# Patient Record
Sex: Female | Born: 1945
Health system: Southern US, Community
[De-identification: ages and names within clinical notes are randomized; demographics above are authoritative.]

## PROBLEM LIST (undated history)

## (undated) DIAGNOSIS — I10 Essential (primary) hypertension: Secondary | ICD-10-CM

## (undated) HISTORY — PX: BREAST BIOPSY: SHX20

## (undated) HISTORY — PX: COLONOSCOPY: SHX174

## (undated) HISTORY — PX: FOOT SURGERY: SHX648

---

## 2006-06-25 ENCOUNTER — Ambulatory Visit: Payer: Self-pay | Admitting: Family Medicine

## 2006-12-25 ENCOUNTER — Ambulatory Visit: Payer: Self-pay | Admitting: Gastroenterology

## 2008-03-03 ENCOUNTER — Ambulatory Visit: Payer: Self-pay | Admitting: Family Medicine

## 2009-05-18 ENCOUNTER — Ambulatory Visit: Payer: Self-pay | Admitting: Family Medicine

## 2010-05-29 ENCOUNTER — Ambulatory Visit: Payer: Self-pay | Admitting: Family Medicine

## 2011-06-04 ENCOUNTER — Ambulatory Visit: Payer: Self-pay | Admitting: Family Medicine

## 2012-12-27 ENCOUNTER — Emergency Department: Payer: Self-pay | Admitting: Emergency Medicine

## 2012-12-27 LAB — BASIC METABOLIC PANEL
Calcium, Total: 9.4 mg/dL (ref 8.5–10.1)
Co2: 28 mmol/L (ref 21–32)
EGFR (African American): >60
EGFR (Non-African Amer.): >60
Glucose: 102 mg/dL — ABNORMAL HIGH (ref 65–99)
Osmolality: 272 (ref 275–301)
Potassium: 3.6 mmol/L (ref 3.5–5.1)

## 2012-12-28 LAB — CBC
HCT: 43.9 % (ref 35.0–47.0)
MCH: 30 pg (ref 26.0–34.0)
MCHC: 33.4 g/dL (ref 32.0–36.0)
MCV: 90 fL (ref 80–100)
Platelet: 266 10*3/uL (ref 150–440)
RDW: 13.1 % (ref 11.5–14.5)
WBC: 9.4 10*3/uL (ref 3.6–11.0)

## 2014-03-22 ENCOUNTER — Ambulatory Visit: Payer: Self-pay | Admitting: Family Medicine

## 2015-01-24 ENCOUNTER — Ambulatory Visit: Payer: Self-pay | Admitting: Family Medicine

## 2015-08-05 ENCOUNTER — Emergency Department
Admission: EM | Admit: 2015-08-05 | Discharge: 2015-08-05 | Disposition: A | Discharge status: Home / Self Care | Payer: PPO | Attending: Emergency Medicine | Admitting: Emergency Medicine

## 2015-08-05 ENCOUNTER — Other Ambulatory Visit: Payer: Self-pay

## 2015-08-05 ENCOUNTER — Encounter: Payer: Self-pay | Admitting: Emergency Medicine

## 2015-08-05 DIAGNOSIS — I10 Essential (primary) hypertension: Secondary | ICD-10-CM | POA: Insufficient documentation

## 2015-08-05 DIAGNOSIS — I160 Hypertensive urgency: Secondary | ICD-10-CM

## 2015-08-05 HISTORY — DX: Essential (primary) hypertension: I10

## 2015-08-05 LAB — CBC WITH DIFFERENTIAL/PLATELET
BASOS PCT: 1 %
Basophils Absolute: 0.1 10*3/uL (ref 0–0.1)
EOS PCT: 1 %
Eosinophils Absolute: 0 10*3/uL (ref 0–0.7)
HEMATOCRIT: 41.8 % (ref 35.0–47.0)
Hemoglobin: 14.5 g/dL (ref 12.0–16.0)
LYMPHS PCT: 16 %
Lymphs Abs: 1.1 10*3/uL (ref 1.0–3.6)
MCH: 30.3 pg (ref 26.0–34.0)
MCHC: 34.6 g/dL (ref 32.0–36.0)
MCV: 87.7 fL (ref 80.0–100.0)
MONO ABS: 0.9 10*3/uL (ref 0.2–0.9)
MONOS PCT: 14 %
NEUTROS ABS: 4.7 10*3/uL (ref 1.4–6.5)
Neutrophils Relative %: 68 %
Platelets: 178 10*3/uL (ref 150–440)
RBC: 4.77 MIL/uL (ref 3.80–5.20)
RDW: 13.8 % (ref 11.5–14.5)
WBC: 6.8 10*3/uL (ref 3.6–11.0)

## 2015-08-05 LAB — COMPREHENSIVE METABOLIC PANEL
ALT: 27 U/L (ref 14–54)
AST: 29 U/L (ref 15–41)
Albumin: 4.2 g/dL (ref 3.5–5.0)
Alkaline Phosphatase: 74 U/L (ref 38–126)
Anion gap: 9 (ref 5–15)
BUN: 15 mg/dL (ref 6–20)
CO2: 26 mmol/L (ref 22–32)
Calcium: 8.9 mg/dL (ref 8.9–10.3)
Chloride: 94 mmol/L — ABNORMAL LOW (ref 101–111)
Creatinine, Ser: 0.57 mg/dL (ref 0.44–1.00)
GFR calc non Af Amer: >60 mL/min (ref >60)
GLUCOSE: 136 mg/dL — AB (ref 65–99)
Potassium: 3.8 mmol/L (ref 3.5–5.1)
Sodium: 129 mmol/L — ABNORMAL LOW (ref 135–145)
Total Bilirubin: 0.3 mg/dL (ref 0.3–1.2)
Total Protein: 7.3 g/dL (ref 6.5–8.1)

## 2015-08-05 LAB — TROPONIN I: Troponin I: <0.03 ng/mL (ref <0.031)

## 2015-08-05 MED ORDER — CLONIDINE HCL 0.1 MG PO TABS
0.1000 mg | ORAL_TABLET | Freq: Three times a day (TID) | ORAL | Status: AC | PRN
Start: 2015-08-05 — End: 2016-09-04

## 2015-08-05 MED ORDER — CLONIDINE HCL 0.1 MG PO TABS
0.1000 mg | ORAL_TABLET | Freq: Once | ORAL | Status: AC
  Administered 2015-08-05: 0.1 mg via ORAL
  Filled 2015-08-05: qty 21

## 2015-08-05 MED ORDER — LORAZEPAM 1 MG PO TABS
1.0000 mg | ORAL_TABLET | Freq: Once | ORAL | Status: AC
  Administered 2015-08-05: 1 mg via ORAL
  Filled 2015-08-05: qty 1

## 2015-08-05 NOTE — ED Provider Notes (Signed)
[facility] Emergency Department Provider Note  ____________________________________________  Time seen: ----------------------------------------- 6:58 PM on 08/05/2015 -----------------------------------------    I have reviewed the triage vital signs and the nursing notes.   HISTORY  Chief Complaint Hypertension    HPI Kristina Jenkins is a 69 y.o. female presents with feelings that her blood pressure is elevated. Patient states she gets a headache and feels nauseated when her blood pressure goes up. She didn't have any real hot numbers at home. Here blood pressure is 142/75 which she states is above her normal. She denies any persistent chest pain or shortness of breath. She states she felt like her heart rate was elevated. She denies any arm or jaw pain. She denies any localization of her headache with no neck pain. She denies any focal weakness in either upper or lower extremities. No difficulty with speech or swallowing.     Past Medical History  Diagnosis Date  . Hypertension     There are no active problems to display for this patient.   Past Surgical History  Procedure Laterality Date  . Foot surgery      No current outpatient prescriptions on file.  Allergies Review of patient's allergies indicates no known allergies.  No family history on file.  Social History History  Substance Use Topics  . Smoking status: Never Smoker   . Smokeless tobacco: Not on file  . Alcohol Use: Not on file    Review of Systems  Constitutional: Negative for fever. Eyes: Negative for visual changes. ENT: Negative for sore throat Cardiovascular: Negative for chest pain. Respiratory: Negative for shortness of breath. Gastrointestinal: Negative for abdominal pain, vomiting and diarrhea. Genitourinary: Negative for dysuria. Musculoskeletal: Negative for back pain. Skin: Negative for rash. Neurological: Negative for headaches or focal  weakness Psychiatric: Patient is very anxious.    ____________________________________________   PHYSICAL EXAM:  VITAL SIGNS: ED Triage Vitals  Enc Vitals Group     BP 08/05/15 1828 144/72 mmHg     Pulse Rate 08/05/15 1828 93     Resp 08/05/15 1828 18     Temp 08/05/15 1828 98.3 F (36.8 C)     Temp Source 08/05/15 1828 Oral     SpO2 08/05/15 1828 98 %     Weight 08/05/15 1828 140 lb (63.504 kg)     Height 08/05/15 1828 5\' 2"  (1.575 m)     Head Cir --      Peak Flow --      Pain Score 08/05/15 1829 4     Pain Loc --      Pain Edu? --      Excl. in [place]? --      Constitutional: Alert and oriented. Well appearing and in no distress. Eyes: Conjunctivae are normal.  ENT   Head: Normocephalic and atraumatic.   Mouth/Throat: Mucous membranes are moist. Cardiovascular: Normal rate, regular rhythm. Normal and symmetric distal pulses are present in all extremities. No murmurs, rubs, or gallops. Respiratory: Normal respiratory effort without tachypnea nor retractions. Breath sounds are clear and equal bilaterally.  Gastrointestinal: Soft and non-tender in all quadrants. No distention. There is no CVA tenderness. Genitourinary: deferred Musculoskeletal: Nontender with normal range of motion in all extremities. No lower extremity tenderness nor edema. Neurologic:  Normal speech and language. No gross focal neurologic deficits are appreciated. Skin:  Skin is warm, dry and intact. No rash noted. Psychiatric: Mood and affect are normal. Patient exhibits appropriate insight and judgment.  ____________________________________________  LABS (pertinent positives/negatives)  Labs Reviewed  CBC WITH DIFFERENTIAL/PLATELET  COMPREHENSIVE METABOLIC PANEL  TROPONIN I    ____________________________________________   EKG  ED ECG REPORT I, Daymon Larsen, the attending physician, personally viewed and interpreted this ECG.  Date: 08/05/2015 EKG Time: 1834 Rate:  87 Rhythm: normal sinus rhythm QRS Axis: normal Intervals: normal ST/T Wave abnormalities: Nonspecific T wave abnormality Conduction Disutrbances: none Narrative Interpretation: unremarkable ischemic changes are noted  ____________________________________________    ____________________________________________   INITIAL IMPRESSION / ASSESSMENT AND PLAN / ED COURSE  Pertinent labs & imaging results that were available during my care of the patient were reviewed by me and considered in my medical decision making (see chart for details). Patient's ED course was uneventful. She was given Ativan for anxiety along with  with clonidine for her blood pressure which gradually decreased over time. Her symptoms all seem to be consistent with her hypertension and systolic pressures are not life-threatening there are symptomatic for the patient. She was prescribed clonidine for backup blood pressure controlled at home. Patient QUESTIONS and concerns were addressed at the bedside. She was discharged home with her husband  ____________________________________________   FINAL CLINICAL IMPRESSION(S) / ED DIAGNOSES Hypertensive urgency Final diagnoses:  None     Daymon Larsen, MD 08/05/15 2149

## 2015-08-05 NOTE — Discharge Instructions (Signed)
Hypertension °Hypertension, commonly called high blood pressure, is when the force of blood pumping through your arteries is too strong. Your arteries are the blood vessels that carry blood from your heart throughout your body. A blood pressure reading consists of a higher number over a lower number, such as 110/72. The higher number (systolic) is the pressure inside your arteries when your heart pumps. The lower number (diastolic) is the pressure inside your arteries when your heart relaxes. Ideally you want your blood pressure below 120/80. °Hypertension forces your heart to work harder to pump blood. Your arteries may become narrow or stiff. Having hypertension puts you at risk for heart disease, stroke, and other problems.  °RISK FACTORS °Some risk factors for high blood pressure are controllable. Others are not.  °Risk factors you cannot control include:  °· Race. You may be at higher risk if you are African American. °· Age. Risk increases with age. °· Gender. Men are at higher risk than women before age 45 years. After age 65, women are at higher risk than men. °Risk factors you can control include: °· Not getting enough exercise or physical activity. °· Being overweight. °· Getting too much fat, sugar, calories, or salt in your diet. °· Drinking too much alcohol. °SIGNS AND SYMPTOMS °Hypertension does not usually cause signs or symptoms. Extremely high blood pressure (hypertensive crisis) may cause headache, anxiety, shortness of breath, and nosebleed. °DIAGNOSIS  °To check if you have hypertension, your health care provider will measure your blood pressure while you are seated, with your arm held at the level of your heart. It should be measured at least twice using the same arm. Certain conditions can cause a difference in blood pressure between your right and left arms. A blood pressure reading that is higher than normal on one occasion does not mean that you need treatment. If one blood pressure reading  is high, ask your health care provider about having it checked again. °TREATMENT  °Treating high blood pressure includes making lifestyle changes and possibly taking medicine. Living a healthy lifestyle can help lower high blood pressure. You may need to change some of your habits. °Lifestyle changes may include: °· Following the DASH diet. This diet is high in fruits, vegetables, and whole grains. It is low in salt, red meat, and added sugars. °· Getting at least 2½ hours of brisk physical activity every week. °· Losing weight if necessary. °· Not smoking. °· Limiting alcoholic beverages. °· Learning ways to reduce stress. ° If lifestyle changes are not enough to get your blood pressure under control, your health care provider may prescribe medicine. You may need to take more than one. Work closely with your health care provider to understand the risks and benefits. °HOME CARE INSTRUCTIONS °· Have your blood pressure rechecked as directed by your health care provider.   °· Take medicines only as directed by your health care provider. Follow the directions carefully. Blood pressure medicines must be taken as prescribed. The medicine does not work as well when you skip doses. Skipping doses also puts you at risk for problems.   °· Do not smoke.   °· Monitor your blood pressure at home as directed by your health care provider.  °SEEK MEDICAL CARE IF:  °· You think you are having a reaction to medicines taken. °· You have recurrent headaches or feel dizzy. °· You have swelling in your ankles. °· You have trouble with your vision. °SEEK IMMEDIATE MEDICAL CARE IF: °· You develop a severe headache or confusion. °·   You have unusual weakness, numbness, or feel faint.  You have severe chest or abdominal pain.  You vomit repeatedly.  You have trouble breathing. MAKE SURE YOU:   Understand these instructions.  Will watch your condition.  Will get help right away if you are not doing well or get worse. Document  Released: 12/16/2005 Document Revised: 05/02/2014 Document Reviewed: 10/08/2013 [facility] Patient Information 2015 [place], [place]. This information is not intended to replace advice given to you by your health care provider. Make sure you discuss any questions you have with your health care provider.   Please return if condition worsens. Please return if he have any persistent headache, chest pain, shortness of breath or any other new concerns.

## 2015-08-05 NOTE — ED Notes (Signed)
Pt reports she was checking BP at home and was increasing.  Also c/o headache and feeling nauseated.  Poor appetite, but was able to eat this morning.  Flushed in the face.

## 2015-08-05 NOTE — ED Notes (Signed)
Complaining of hypertension and increased heart rate, headache x1day , denies visual change.

## 2016-06-14 DIAGNOSIS — Z79899 Other long term (current) drug therapy: Secondary | ICD-10-CM | POA: Diagnosis not present

## 2016-06-14 DIAGNOSIS — M8580 Other specified disorders of bone density and structure, unspecified site: Secondary | ICD-10-CM | POA: Diagnosis not present

## 2016-06-14 DIAGNOSIS — I10 Essential (primary) hypertension: Secondary | ICD-10-CM | POA: Diagnosis not present

## 2016-06-14 DIAGNOSIS — Z7982 Long term (current) use of aspirin: Secondary | ICD-10-CM | POA: Diagnosis not present

## 2016-06-14 DIAGNOSIS — E7800 Pure hypercholesterolemia, unspecified: Secondary | ICD-10-CM | POA: Diagnosis not present

## 2016-06-25 DIAGNOSIS — I10 Essential (primary) hypertension: Secondary | ICD-10-CM | POA: Diagnosis not present

## 2016-06-25 DIAGNOSIS — Z1239 Encounter for other screening for malignant neoplasm of breast: Secondary | ICD-10-CM | POA: Diagnosis not present

## 2016-06-25 DIAGNOSIS — Z0000 Encounter for general adult medical examination without abnormal findings: Secondary | ICD-10-CM | POA: Diagnosis not present

## 2016-06-25 DIAGNOSIS — E7800 Pure hypercholesterolemia, unspecified: Secondary | ICD-10-CM | POA: Diagnosis not present

## 2016-06-25 DIAGNOSIS — M8580 Other specified disorders of bone density and structure, unspecified site: Secondary | ICD-10-CM | POA: Diagnosis not present

## 2016-06-25 DIAGNOSIS — Z1211 Encounter for screening for malignant neoplasm of colon: Secondary | ICD-10-CM | POA: Diagnosis not present

## 2016-10-15 DIAGNOSIS — S8000XA Contusion of unspecified knee, initial encounter: Secondary | ICD-10-CM | POA: Diagnosis not present

## 2016-10-22 DIAGNOSIS — M25561 Pain in right knee: Secondary | ICD-10-CM | POA: Diagnosis not present

## 2016-10-22 DIAGNOSIS — M25562 Pain in left knee: Secondary | ICD-10-CM | POA: Diagnosis not present

## 2016-11-04 DIAGNOSIS — Z8371 Family history of colonic polyps: Secondary | ICD-10-CM | POA: Diagnosis not present

## 2016-11-04 DIAGNOSIS — Z719 Counseling, unspecified: Secondary | ICD-10-CM | POA: Diagnosis not present

## 2017-02-06 ENCOUNTER — Other Ambulatory Visit: Payer: Self-pay | Admitting: Family Medicine

## 2017-02-06 DIAGNOSIS — Z1231 Encounter for screening mammogram for malignant neoplasm of breast: Secondary | ICD-10-CM

## 2017-02-17 ENCOUNTER — Encounter: Payer: Self-pay | Admitting: *Deleted

## 2017-02-18 ENCOUNTER — Ambulatory Visit: Payer: PPO | Admitting: *Deleted

## 2017-02-18 ENCOUNTER — Encounter: Payer: Self-pay | Admitting: *Deleted

## 2017-02-18 ENCOUNTER — Encounter
Admission: RE | Disposition: A | Discharge status: Home / Self Care | Payer: Self-pay | Source: Ambulatory Visit | Attending: Gastroenterology

## 2017-02-18 ENCOUNTER — Ambulatory Visit
Admission: RE | Admit: 2017-02-18 | Discharge: 2017-02-18 | Disposition: A | Discharge status: Home / Self Care | Payer: PPO | Source: Ambulatory Visit | Attending: Gastroenterology | Admitting: Gastroenterology

## 2017-02-18 DIAGNOSIS — K644 Residual hemorrhoidal skin tags: Secondary | ICD-10-CM | POA: Insufficient documentation

## 2017-02-18 DIAGNOSIS — K5730 Diverticulosis of large intestine without perforation or abscess without bleeding: Secondary | ICD-10-CM | POA: Insufficient documentation

## 2017-02-18 DIAGNOSIS — Z7982 Long term (current) use of aspirin: Secondary | ICD-10-CM | POA: Insufficient documentation

## 2017-02-18 DIAGNOSIS — I10 Essential (primary) hypertension: Secondary | ICD-10-CM | POA: Diagnosis not present

## 2017-02-18 DIAGNOSIS — D121 Benign neoplasm of appendix: Secondary | ICD-10-CM | POA: Insufficient documentation

## 2017-02-18 DIAGNOSIS — Z1211 Encounter for screening for malignant neoplasm of colon: Secondary | ICD-10-CM | POA: Insufficient documentation

## 2017-02-18 DIAGNOSIS — Z8371 Family history of colonic polyps: Secondary | ICD-10-CM | POA: Diagnosis not present

## 2017-02-18 DIAGNOSIS — K641 Second degree hemorrhoids: Secondary | ICD-10-CM | POA: Diagnosis not present

## 2017-02-18 DIAGNOSIS — D120 Benign neoplasm of cecum: Secondary | ICD-10-CM | POA: Diagnosis not present

## 2017-02-18 DIAGNOSIS — Z79899 Other long term (current) drug therapy: Secondary | ICD-10-CM | POA: Diagnosis not present

## 2017-02-18 DIAGNOSIS — K5790 Diverticulosis of intestine, part unspecified, without perforation or abscess without bleeding: Secondary | ICD-10-CM | POA: Diagnosis not present

## 2017-02-18 DIAGNOSIS — K635 Polyp of colon: Secondary | ICD-10-CM | POA: Diagnosis not present

## 2017-02-18 DIAGNOSIS — K648 Other hemorrhoids: Secondary | ICD-10-CM | POA: Diagnosis not present

## 2017-02-18 HISTORY — PX: COLONOSCOPY WITH PROPOFOL: SHX5780

## 2017-02-18 SURGERY — COLONOSCOPY WITH PROPOFOL
Anesthesia: General

## 2017-02-18 MED ORDER — PROPOFOL 500 MG/50ML IV EMUL
INTRAVENOUS | Status: AC
  Filled 2017-02-18: qty 50

## 2017-02-18 MED ORDER — GLYCOPYRROLATE 0.2 MG/ML IJ SOLN
INTRAMUSCULAR | Status: AC
  Filled 2017-02-18: qty 1

## 2017-02-18 MED ORDER — PROPOFOL 500 MG/50ML IV EMUL
INTRAVENOUS | Status: AC
Start: 2017-02-18 — End: 2017-02-18
  Filled 2017-02-18: qty 50

## 2017-02-18 MED ORDER — SODIUM CHLORIDE 0.9 % IV SOLN: INTRAVENOUS | Status: DC

## 2017-02-18 MED ORDER — PROPOFOL 500 MG/50ML IV EMUL
INTRAVENOUS | Status: DC | PRN
  Administered 2017-02-18: 100 ug/kg/min via INTRAVENOUS

## 2017-02-18 MED ORDER — SODIUM CHLORIDE 0.9 % IV SOLN
INTRAVENOUS | Status: DC
  Administered 2017-02-18 (×2): via INTRAVENOUS

## 2017-02-18 MED ORDER — PROPOFOL 10 MG/ML IV BOLUS
INTRAVENOUS | Status: AC
Start: 2017-02-18 — End: 2017-02-18
  Filled 2017-02-18: qty 20

## 2017-02-18 NOTE — H&P (Signed)
Outpatient short stay form Pre-procedure 02/18/2017 11:22 AM Lollie Sails MD  Primary Physician: Dr. Gayland Curry  Reason for visit:  Colonoscopy  History of present illness:  Patient is a 71 year old female presenting today as above. She tolerated her prep well. She takes no aspirin or blood thinning agents.    Current Facility-Administered Medications:  .  0.9 %  sodium chloride infusion, , Intravenous, Continuous, Lollie Sails, MD, Last Rate: 20 mL/hr at 02/18/17 1029 .  0.9 %  sodium chloride infusion, , Intravenous, Continuous, Lollie Sails, MD  Prescriptions Prior to Admission  Medication Sig Dispense Refill Last Dose  . amLODipine (NORVASC) 5 MG tablet Take 5 mg by mouth daily.   02/18/2017 at Unknown time  . aspirin EC 81 MG tablet Take 81 mg by mouth daily.   02/12/2017  . CALCIUM CARBONATE PO Take by mouth.     . Multiple Vitamin (MULTIVITAMIN) capsule Take 1 capsule by mouth daily.   Past Week at Unknown time  . omega-3 acid ethyl esters (LOVAZA) 1 g capsule Take by mouth 2 (two) times daily.   Past Week at Unknown time  . cloNIDine (CATAPRES) 0.1 MG tablet Take 1 tablet (0.1 mg total) by mouth 3 (three) times daily as needed (hypertension). 30 tablet 11      Allergies  Allergen Reactions  . Hctz [Hydrochlorothiazide]      Past Medical History:  Diagnosis Date  . Hypertension     Review of systems:      Physical Exam    Heart and lungs: Regular rate and rhythm without rub or gallop, lungs are bilaterally clear.    HEENT: Normocephalic atraumatic eyes are anicteric    Other:     Pertinant exam for procedure: Soft nontender nondistended bowel sounds positive normoactive.    Planned proceedures: Colonoscopy and indicated procedures. I have discussed the risks benefits and complications of procedures to include not limited to bleeding, infection, perforation and the risk of sedation and the patient wishes to proceed.    Lollie Sails, MD Gastroenterology 02/18/2017  11:22 AM

## 2017-02-18 NOTE — Transfer of Care (Signed)
Immediate Anesthesia Transfer of Care Note  Patient: Kristina Jenkins  Procedure(s) Performed: Procedure(s): COLONOSCOPY WITH PROPOFOL (N/A)  Patient Location: PACU  Anesthesia Type:General  Level of Consciousness: awake, alert  and oriented  Airway & Oxygen Therapy: Patient Spontanous Breathing and Patient connected to nasal cannula oxygen  Post-op Assessment: Report given to RN and Post -op Vital signs reviewed and stable  Post vital signs: Reviewed and stable  Last Vitals:  Vitals:   02/18/17 1006  BP: (!) 108/54  Pulse: 74  Resp: 16  Temp: 36.6 C    Last Pain:  Vitals:   02/18/17 1006  TempSrc: Tympanic      Patients Stated Pain Goal: 0 ([id number])  Complications: No apparent anesthesia complications

## 2017-02-18 NOTE — Anesthesia Post-op Follow-up Note (Cosign Needed)
Anesthesia QCDR form completed.        

## 2017-02-18 NOTE — Anesthesia Postprocedure Evaluation (Signed)
Anesthesia Post Note  Patient: Kristina Jenkins  Procedure(s) Performed: Procedure(s) (LRB): COLONOSCOPY WITH PROPOFOL (N/A)  Patient location during evaluation: Endoscopy Anesthesia Type: General Level of consciousness: awake and alert Pain management: pain level controlled Vital Signs Assessment: post-procedure vital signs reviewed and stable Respiratory status: spontaneous breathing, nonlabored ventilation, respiratory function stable and patient connected to nasal cannula oxygen Cardiovascular status: blood pressure returned to baseline and stable Postop Assessment: no signs of nausea or vomiting Anesthetic complications: no     Last Vitals:  Vitals:   02/18/17 1224 02/18/17 1234  BP: 118/74 111/69  Pulse: 80 85  Resp: 17 (!) 23  Temp:      Last Pain:  Vitals:   02/18/17 1214  TempSrc: Tympanic                 Martha Clan

## 2017-02-18 NOTE — Op Note (Signed)
[facility] Gastroenterology Patient Name: Kristina Jenkins Procedure Date: 02/18/2017 11:17 AM MRN: [id number] Account #: [id number] Date of Birth: 1946-01-05 Admit Type: Outpatient Age: 71 Room: [facility] ENDO ROOM 3 Gender: Female Note Status: Finalized Procedure:            Colonoscopy Indications:          Family history of colonic polyps in a first-degree                        relative Providers:            Lollie Sails, MD Referring MD:         Gayland Curry MD, MD (Referring MD) Medicines:            Monitored Anesthesia Care Complications:        No immediate complications. Procedure:            Pre-Anesthesia Assessment:                       - ASA Grade Assessment: II - A patient with mild                        systemic disease.                       After obtaining informed consent, the colonoscope was                        passed under direct vision. Throughout the procedure,                        the patient's blood pressure, pulse, and oxygen                        saturations were monitored continuously. The                        Colonoscope was introduced through the anus and                        advanced to the the cecum, identified by appendiceal                        orifice and ileocecal valve. The colonoscopy was                        performed with moderate difficulty due to significant                        looping. Successful completion of the procedure was                        aided by using manual pressure. The patient tolerated                        the procedure well. The quality of the bowel                        preparation was good. Findings:      Multiple small-mouthed diverticula were found in the sigmoid colon and  distal descending colon.      A 3 mm polyp was found in the appendiceal orifice. The polyp was       sessile. The polyp was removed with a cold biopsy forceps. Resection and       retrieval were  complete.      A 6 mm polyp was found in the cecum. The polyp was sessile. The polyp       was removed with a cold biopsy forceps. The polyp was removed with a       cold snare. The polyp was removed with a lift and cut technique using a       cold snare. Resection and retrieval were complete.      Non-bleeding external and internal hemorrhoids were found during       retroflexion and during anoscopy. The hemorrhoids were small and Grade       II (internal hemorrhoids that prolapse but reduce spontaneously).      No additional abnormalities were found on retroflexion.      The digital rectal exam was normal otherwise. Impression:           - Diverticulosis in the sigmoid colon and in the distal                        descending colon.                       - One 3 mm polyp at the appendiceal orifice, removed                        with a cold biopsy forceps. Resected and retrieved.                       - One 6 mm polyp in the cecum, removed with a cold                        snare, removed using lift and cut and a cold snare and                        removed with a cold biopsy forceps. Resected and                        retrieved.                       - Non-bleeding external and internal hemorrhoids. Recommendation:       - Discharge patient to home.                       - Telephone GI clinic for pathology results in 1 week. Procedure Code(s):    --- Professional ---                       [phone], Colonoscopy, flexible; with removal of tumor(s),                        polyp(s), or other lesion(s) by snare technique                       [id number], 59, Colonoscopy, flexible; with biopsy, single  or multiple Diagnosis Code(s):    --- Professional ---                       K64.1, Second degree hemorrhoids                       D12.1, Benign neoplasm of appendix                       D12.0, Benign neoplasm of cecum                       Z83.71, Family history of colonic  polyps                       K57.30, Diverticulosis of large intestine without                        perforation or abscess without bleeding CPT copyright 2016 American Medical Association. All rights reserved. The codes documented in this report are preliminary and upon coder review may  be revised to meet current compliance requirements. Lollie Sails, MD 02/18/2017 12:14:46 PM This report has been signed electronically. Number of Addenda: 0 Note Initiated On: 02/18/2017 11:17 AM Scope Withdrawal Time: 0 hours 15 minutes 46 seconds  Total Procedure Duration: 0 hours 34 minutes 52 seconds       [facility]

## 2017-02-18 NOTE — Anesthesia Preprocedure Evaluation (Signed)
Anesthesia Evaluation  Patient identified by MRN, date of birth, ID band Patient awake    Reviewed: Allergy & Precautions, H&P , NPO status , Patient's Chart, lab work & pertinent test results, reviewed documented beta blocker date and time   Airway Mallampati: II   Neck ROM: full    Dental  (+) Poor Dentition   Pulmonary neg pulmonary ROS,    Pulmonary exam normal        Cardiovascular hypertension, negative cardio ROS Normal cardiovascular exam Rhythm:regular Rate:Normal     Neuro/Psych negative neurological ROS  negative psych ROS   GI/Hepatic negative GI ROS, Neg liver ROS,   Endo/Other  negative endocrine ROS  Renal/GU negative Renal ROS  negative genitourinary   Musculoskeletal   Abdominal   Peds  Hematology negative hematology ROS (+)   Anesthesia Other Findings Past Medical History: No date: Hypertension Past Surgical History: No date: COLONOSCOPY No date: FOOT SURGERY BMI    Body Mass Index:  25.61 kg/m     Reproductive/Obstetrics negative OB ROS                             Anesthesia Physical Anesthesia Plan  ASA: II  Anesthesia Plan: General   Post-op Pain Management:    Induction:   Airway Management Planned:   Additional Equipment:   Intra-op Plan:   Post-operative Plan:   Informed Consent: I have reviewed the patients History and Physical, chart, labs and discussed the procedure including the risks, benefits and alternatives for the proposed anesthesia with the patient or authorized representative who has indicated his/her understanding and acceptance.   Dental Advisory Given  Plan Discussed with: CRNA  Anesthesia Plan Comments:         Anesthesia Quick Evaluation

## 2017-02-19 ENCOUNTER — Encounter: Payer: Self-pay | Admitting: Gastroenterology

## 2017-02-19 LAB — SURGICAL PATHOLOGY

## 2017-03-06 ENCOUNTER — Ambulatory Visit
Admission: RE | Admit: 2017-03-06 | Discharge: 2017-03-06 | Disposition: A | Discharge status: Home / Self Care | Payer: PPO | Source: Ambulatory Visit | Attending: Family Medicine | Admitting: Family Medicine

## 2017-03-06 DIAGNOSIS — Z1231 Encounter for screening mammogram for malignant neoplasm of breast: Secondary | ICD-10-CM | POA: Insufficient documentation

## 2017-06-25 DIAGNOSIS — I10 Essential (primary) hypertension: Secondary | ICD-10-CM | POA: Diagnosis not present

## 2017-06-25 DIAGNOSIS — E7800 Pure hypercholesterolemia, unspecified: Secondary | ICD-10-CM | POA: Diagnosis not present

## 2017-06-25 DIAGNOSIS — M8580 Other specified disorders of bone density and structure, unspecified site: Secondary | ICD-10-CM | POA: Diagnosis not present

## 2017-07-09 ENCOUNTER — Other Ambulatory Visit: Payer: Self-pay | Admitting: Family Medicine

## 2017-07-09 DIAGNOSIS — M8580 Other specified disorders of bone density and structure, unspecified site: Secondary | ICD-10-CM | POA: Diagnosis not present

## 2017-07-09 DIAGNOSIS — E559 Vitamin D deficiency, unspecified: Secondary | ICD-10-CM | POA: Diagnosis not present

## 2017-07-09 DIAGNOSIS — Z0000 Encounter for general adult medical examination without abnormal findings: Secondary | ICD-10-CM | POA: Diagnosis not present

## 2017-07-09 DIAGNOSIS — E7800 Pure hypercholesterolemia, unspecified: Secondary | ICD-10-CM | POA: Diagnosis not present

## 2017-07-09 DIAGNOSIS — I10 Essential (primary) hypertension: Secondary | ICD-10-CM | POA: Diagnosis not present

## 2017-10-09 DIAGNOSIS — E559 Vitamin D deficiency, unspecified: Secondary | ICD-10-CM | POA: Diagnosis not present

## 2017-10-09 DIAGNOSIS — E7800 Pure hypercholesterolemia, unspecified: Secondary | ICD-10-CM | POA: Diagnosis not present

## 2017-10-09 DIAGNOSIS — I10 Essential (primary) hypertension: Secondary | ICD-10-CM | POA: Diagnosis not present

## 2017-10-22 DIAGNOSIS — H43393 Other vitreous opacities, bilateral: Secondary | ICD-10-CM | POA: Diagnosis not present

## 2017-10-22 DIAGNOSIS — H2513 Age-related nuclear cataract, bilateral: Secondary | ICD-10-CM | POA: Diagnosis not present

## 2017-12-15 DIAGNOSIS — L821 Other seborrheic keratosis: Secondary | ICD-10-CM | POA: Diagnosis not present

## 2018-02-03 DIAGNOSIS — M1711 Unilateral primary osteoarthritis, right knee: Secondary | ICD-10-CM | POA: Diagnosis not present

## 2018-07-10 DIAGNOSIS — I10 Essential (primary) hypertension: Secondary | ICD-10-CM | POA: Diagnosis not present

## 2018-07-10 DIAGNOSIS — E7800 Pure hypercholesterolemia, unspecified: Secondary | ICD-10-CM | POA: Diagnosis not present

## 2018-07-13 DIAGNOSIS — Z0000 Encounter for general adult medical examination without abnormal findings: Secondary | ICD-10-CM | POA: Diagnosis not present

## 2018-07-14 ENCOUNTER — Other Ambulatory Visit: Payer: Self-pay | Admitting: Family Medicine

## 2018-07-14 DIAGNOSIS — Z780 Asymptomatic menopausal state: Secondary | ICD-10-CM

## 2018-07-14 DIAGNOSIS — Z1239 Encounter for other screening for malignant neoplasm of breast: Secondary | ICD-10-CM

## 2018-08-18 ENCOUNTER — Ambulatory Visit
Admission: RE | Admit: 2018-08-18 | Discharge: 2018-08-18 | Disposition: A | Discharge status: Home / Self Care | Payer: PPO | Source: Ambulatory Visit | Attending: Family Medicine | Admitting: Family Medicine

## 2018-08-18 DIAGNOSIS — Z1231 Encounter for screening mammogram for malignant neoplasm of breast: Secondary | ICD-10-CM | POA: Diagnosis not present

## 2018-08-18 DIAGNOSIS — Z780 Asymptomatic menopausal state: Secondary | ICD-10-CM | POA: Diagnosis not present

## 2018-08-18 DIAGNOSIS — M8589 Other specified disorders of bone density and structure, multiple sites: Secondary | ICD-10-CM | POA: Diagnosis not present

## 2018-08-18 DIAGNOSIS — Z1239 Encounter for other screening for malignant neoplasm of breast: Secondary | ICD-10-CM

## 2018-08-25 DIAGNOSIS — H6123 Impacted cerumen, bilateral: Secondary | ICD-10-CM | POA: Diagnosis not present

## 2020-04-10 ENCOUNTER — Other Ambulatory Visit: Payer: Self-pay

## 2020-04-10 ENCOUNTER — Ambulatory Visit: Payer: PPO | Attending: Internal Medicine

## 2020-04-10 DIAGNOSIS — Z23 Encounter for immunization: Secondary | ICD-10-CM

## 2020-04-10 NOTE — Progress Notes (Signed)
   Covid-19 Vaccination Clinic  Name:  Kristina Jenkins    MRN: [id number] DOB: 10/08/1946  04/10/2020  [name] was observed post Covid-19 immunization for 15 minutes without incident. She was provided with Vaccine Information Sheet and instruction to access the V-Safe system.   [name] was instructed to call 911 with any severe reactions post vaccine: Marland Kitchen Difficulty breathing  . Swelling of face and throat  . A fast heartbeat  . A bad rash all over body  . Dizziness and weakness   Immunizations Administered    Name Date Dose VIS Date Route   Pfizer COVID-19 Vaccine 04/10/2020  9:14 AM 0.3 mL 12/10/2019 Intramuscular   Manufacturer: [place]   Lot: [id number]   [place]: [id number]

## 2020-05-03 ENCOUNTER — Ambulatory Visit: Payer: PPO | Attending: Internal Medicine

## 2020-05-03 DIAGNOSIS — Z23 Encounter for immunization: Secondary | ICD-10-CM

## 2020-05-03 NOTE — Progress Notes (Signed)
   Covid-19 Vaccination Clinic  Name:  Kristina Jenkins    MRN: [id number] DOB: 1946-10-24  05/03/2020  [name] was observed post Covid-19 immunization for 15 minutes without incident. She was provided with Vaccine Information Sheet and instruction to access the V-Safe system.   [name] was instructed to call 911 with any severe reactions post vaccine: Marland Kitchen Difficulty breathing  . Swelling of face and throat  . A fast heartbeat  . A bad rash all over body  . Dizziness and weakness   Immunizations Administered    Name Date Dose VIS Date Route   Pfizer COVID-19 Vaccine 05/03/2020 10:41 AM 0.3 mL 02/23/2019 Intramuscular   Manufacturer: [place]   Lot: [id number]   [place]: [id number]

## 2022-01-23 ENCOUNTER — Other Ambulatory Visit: Payer: Self-pay

## 2022-01-23 ENCOUNTER — Ambulatory Visit: Payer: Medicare Other | Admitting: Dermatology

## 2022-01-23 DIAGNOSIS — D2339 Other benign neoplasm of skin of other parts of face: Secondary | ICD-10-CM

## 2022-01-23 DIAGNOSIS — D1800 Hemangioma unspecified site: Secondary | ICD-10-CM | POA: Diagnosis not present

## 2022-01-23 DIAGNOSIS — L578 Other skin changes due to chronic exposure to nonionizing radiation: Secondary | ICD-10-CM

## 2022-01-23 DIAGNOSIS — L820 Inflamed seborrheic keratosis: Secondary | ICD-10-CM | POA: Diagnosis not present

## 2022-01-23 DIAGNOSIS — L570 Actinic keratosis: Secondary | ICD-10-CM

## 2022-01-23 DIAGNOSIS — D239 Other benign neoplasm of skin, unspecified: Secondary | ICD-10-CM

## 2022-01-23 DIAGNOSIS — L821 Other seborrheic keratosis: Secondary | ICD-10-CM

## 2022-01-23 NOTE — Patient Instructions (Signed)

## 2022-01-23 NOTE — Progress Notes (Signed)
New Patient Visit  Subjective  Kristina Jenkins is a 76 y.o. female who presents for the following: Spot Check (No hx of skin cancer or dysplastic nevi. ). The patient has spots, moles and lesions to be evaluated, some may be new or changing and the patient has concerns that these could be cancer.  Objective  Well appearing patient in no apparent distress; mood and affect are within normal limits.  A focused examination was performed including face. Relevant physical exam findings are noted in the Assessment and Plan.  left nose x 1, left temple x 1, right temple x 1 (3) Erythematous thin papules/macules with gritty scale.   left infraorbital area 0.3 cm blue/brown macule  right proximal mandible x 1 Erythematous keratotic or waxy stuck-on papule or plaque.    Assessment & Plan  AK (actinic keratosis) (3) left nose x 1, left temple x 1, right temple x 1 Actinic keratoses are precancerous spots that appear secondary to cumulative UV radiation exposure/sun exposure over time. They are chronic with expected duration over 1 year. A portion of actinic keratoses will progress to squamous cell carcinoma of the skin. It is not possible to reliably predict which spots will progress to skin cancer and so treatment is recommended to prevent development of skin cancer. Recommend daily broad spectrum sunscreen SPF 30+ to sun-exposed areas, reapply every 2 hours as needed.  Recommend staying in the shade or wearing long sleeves, sun glasses (UVA+UVB protection) and wide brim hats (4-inch brim around the entire circumference of the hat). Call for new or changing lesions. Prior to procedure, discussed risks of blister formation, small wound, skin dyspigmentation, or rare scar following cryotherapy. Recommend Vaseline ointment to treated areas while healing.  Destruction of lesion - left nose x 1, left temple x 1, right temple x 1 Complexity: simple   Destruction method: cryotherapy   Informed  consent: discussed and consent obtained   Timeout:  patient name, date of birth, surgical site, and procedure verified Lesion destroyed using liquid nitrogen: Yes   Region frozen until ice ball extended beyond lesion: Yes   Outcome: patient tolerated procedure well with no complications   Post-procedure details: wound care instructions given    Blue nevus left infraorbital area Benign-appearing.  Observation.  Call clinic for new or changing moles.  Recommend daily use of broad spectrum spf 30+ sunscreen to sun-exposed areas.   Has been there all her life - no change  Inflamed seborrheic keratosis right proximal mandible x 1 Prior to procedure, discussed risks of blister formation, small wound, skin dyspigmentation, or rare scar following cryotherapy. Recommend Vaseline ointment to treated areas while healing.  Destruction of lesion - right proximal mandible x 1 Complexity: simple   Destruction method: cryotherapy   Informed consent: discussed and consent obtained   Timeout:  patient name, date of birth, surgical site, and procedure verified Lesion destroyed using liquid nitrogen: Yes   Region frozen until ice ball extended beyond lesion: Yes   Outcome: patient tolerated procedure well with no complications   Post-procedure details: wound care instructions given    Seborrheic Keratoses - Stuck-on, waxy, tan-brown papules and/or plaques  - Benign-appearing - Discussed benign etiology and prognosis. - Observe - Call for any changes  Hemangiomas - Red papules - Discussed benign nature - Observe - Call for any changes  Actinic Damage - chronic, secondary to cumulative UV radiation exposure/sun exposure over time - diffuse scaly erythematous macules with underlying dyspigmentation - Recommend daily broad  spectrum sunscreen SPF 30+ to sun-exposed areas, reapply every 2 hours as needed.  - Recommend staying in the shade or wearing long sleeves, sun glasses (UVA+UVB protection)  and wide brim hats (4-inch brim around the entire circumference of the hat). - Call for new or changing lesions.  Return in about 3 months (around 04/23/2022) for AK f/u.  IHarriett Sine, CMA, am acting as scribe for Sarina Ser, MD. Documentation: I have reviewed the above documentation for accuracy and completeness, and I agree with the above.  Sarina Ser, MD

## 2022-01-27 ENCOUNTER — Encounter: Payer: Self-pay | Admitting: Dermatology

## 2022-04-24 ENCOUNTER — Ambulatory Visit: Payer: Medicare Other | Admitting: Dermatology

## 2022-04-24 DIAGNOSIS — L578 Other skin changes due to chronic exposure to nonionizing radiation: Secondary | ICD-10-CM | POA: Diagnosis not present

## 2022-04-24 DIAGNOSIS — L738 Other specified follicular disorders: Secondary | ICD-10-CM | POA: Diagnosis not present

## 2022-04-24 DIAGNOSIS — Z872 Personal history of diseases of the skin and subcutaneous tissue: Secondary | ICD-10-CM | POA: Diagnosis not present

## 2022-04-24 DIAGNOSIS — L570 Actinic keratosis: Secondary | ICD-10-CM | POA: Diagnosis not present

## 2022-04-24 NOTE — Patient Instructions (Signed)

## 2022-04-24 NOTE — Progress Notes (Signed)
? ?  Follow-Up Visit ?  ?Subjective  ?Kristina Jenkins is a 76 y.o. female who presents for the following: Actinic Keratosis (3 month follow up of left nose and bilateral temples treated with LN2). ?The patient has spots, moles and lesions to be evaluated, some may be new or changing and the patient has concerns that these could be cancer. ? ?The following portions of the chart were reviewed this encounter and updated as appropriate:  ? Tobacco  Allergies  Meds  Problems  Med Hx  Surg Hx  Fam Hx   ?  ?Review of Systems:  No other skin or systemic complaints except as noted in HPI or Assessment and Plan. ? ?Objective  ?Well appearing patient in no apparent distress; mood and affect are within normal limits. ? ?A focused examination was performed including face. Relevant physical exam findings are noted in the Assessment and Plan. ? ?Nose ?Yellow papule ? ?Left Eyebrow ?Erythematous thin papules/macules with gritty scale.  ? ?Left nose, bilateral temples ?Clear ? ? ?Assessment & Plan  ? ?Actinic Damage ?- chronic, secondary to cumulative UV radiation exposure/sun exposure over time ?- diffuse scaly erythematous macules with underlying dyspigmentation ?- Recommend daily broad spectrum sunscreen SPF 30+ to sun-exposed areas, reapply every 2 hours as needed.  ?- Recommend staying in the shade or wearing long sleeves, sun glasses (UVA+UVB protection) and wide brim hats (4-inch brim around the entire circumference of the hat). ?- Call for new or changing lesions. ? ?Sebaceous hyperplasia ?Nose ?Benign-appearing.  Observation.  Call clinic for new or changing lesions.  Recommend daily use of broad spectrum spf 30+ sunscreen to sun-exposed areas.  ? ?AK (actinic keratosis) ?Left Eyebrow ?Destruction of lesion - Left Eyebrow ?Complexity: simple   ?Destruction method: cryotherapy   ?Informed consent: discussed and consent obtained   ?Timeout:  patient name, date of birth, surgical site, and procedure verified ?Lesion  destroyed using liquid nitrogen: Yes   ?Region frozen until ice ball extended beyond lesion: Yes   ?Outcome: patient tolerated procedure well with no complications   ?Post-procedure details: wound care instructions given   ? ?History of actinic keratoses ?Left nose, bilateral temples ?Clear. Observe for recurrence. Call clinic for new or changing lesions.  Recommend regular skin exams, daily broad-spectrum spf 30+ sunscreen use, and photoprotection.    ? ?Return in about 1 year (around 04/25/2023). ? ?I, Ashok Cordia, CMA, am acting as scribe for Sarina Ser, MD . ?Documentation: I have reviewed the above documentation for accuracy and completeness, and I agree with the above. ? ?Sarina Ser, MD ? ?

## 2022-05-05 ENCOUNTER — Encounter: Payer: Self-pay | Admitting: Dermatology

## 2022-10-23 ENCOUNTER — Other Ambulatory Visit: Payer: Self-pay | Admitting: Internal Medicine

## 2022-10-23 ENCOUNTER — Ambulatory Visit
Admission: RE | Admit: 2022-10-23 | Discharge: 2022-10-23 | Disposition: A | Discharge status: Home / Self Care | Payer: Medicare Other | Source: Ambulatory Visit | Attending: Internal Medicine | Admitting: Internal Medicine

## 2022-10-23 DIAGNOSIS — E7800 Pure hypercholesterolemia, unspecified: Secondary | ICD-10-CM | POA: Insufficient documentation

## 2022-10-23 DIAGNOSIS — I10 Essential (primary) hypertension: Secondary | ICD-10-CM

## 2023-02-04 ENCOUNTER — Ambulatory Visit
Admission: RE | Admit: 2023-02-04 | Discharge: 2023-02-04 | Disposition: A | Discharge status: Home / Self Care | Payer: Medicare Other | Source: Ambulatory Visit | Attending: Gastroenterology | Admitting: Gastroenterology

## 2023-02-04 ENCOUNTER — Encounter: Admission: RE | Payer: Self-pay | Source: Ambulatory Visit

## 2023-02-04 ENCOUNTER — Ambulatory Visit: Admission: RE | Admit: 2023-02-04 | Payer: Medicare Other | Source: Ambulatory Visit

## 2023-02-04 ENCOUNTER — Ambulatory Visit: Payer: Medicare Other | Admitting: Certified Registered"

## 2023-02-04 ENCOUNTER — Encounter: Payer: Self-pay | Admitting: *Deleted

## 2023-02-04 ENCOUNTER — Encounter
Admission: RE | Disposition: A | Discharge status: Home / Self Care | Payer: Self-pay | Source: Ambulatory Visit | Attending: Gastroenterology

## 2023-02-04 DIAGNOSIS — K640 First degree hemorrhoids: Secondary | ICD-10-CM | POA: Diagnosis not present

## 2023-02-04 DIAGNOSIS — I10 Essential (primary) hypertension: Secondary | ICD-10-CM | POA: Insufficient documentation

## 2023-02-04 DIAGNOSIS — K5730 Diverticulosis of large intestine without perforation or abscess without bleeding: Secondary | ICD-10-CM | POA: Diagnosis not present

## 2023-02-04 DIAGNOSIS — Z1211 Encounter for screening for malignant neoplasm of colon: Secondary | ICD-10-CM | POA: Diagnosis not present

## 2023-02-04 DIAGNOSIS — Z86010 Personal history of colonic polyps: Secondary | ICD-10-CM | POA: Diagnosis not present

## 2023-02-04 HISTORY — PX: COLONOSCOPY WITH PROPOFOL: SHX5780

## 2023-02-04 SURGERY — COLONOSCOPY WITH PROPOFOL
Anesthesia: General

## 2023-02-04 MED ORDER — PROPOFOL 500 MG/50ML IV EMUL
INTRAVENOUS | Status: DC | PRN
  Administered 2023-02-04: 125 ug/kg/min via INTRAVENOUS
  Administered 2023-02-04: 80 mg via INTRAVENOUS

## 2023-02-04 MED ORDER — SODIUM CHLORIDE 0.9 % IV SOLN
INTRAVENOUS | Status: DC
  Administered 2023-02-04: 1000 mL via INTRAVENOUS

## 2023-02-04 MED ORDER — LIDOCAINE HCL (CARDIAC) PF 100 MG/5ML IV SOSY
PREFILLED_SYRINGE | INTRAVENOUS | Status: DC | PRN
  Administered 2023-02-04: 50 mg via INTRAVENOUS

## 2023-02-04 NOTE — Transfer of Care (Signed)
Immediate Anesthesia Transfer of Care Note  Patient: Kristina Jenkins  Procedure(s) Performed: COLONOSCOPY WITH PROPOFOL  Patient Location: PACU  Anesthesia Type:General  Level of Consciousness: drowsy  Airway & Oxygen Therapy: Patient Spontanous Breathing and Patient connected to nasal cannula oxygen  Post-op Assessment: Report given to RN and Post -op Vital signs reviewed and stable  Post vital signs: Reviewed and stable  Last Vitals:  Vitals Value Taken Time  BP 106/66 02/04/23 0817  Temp 35.9 C 02/04/23 0816  Pulse 71 02/04/23 0817  Resp 17 02/04/23 0817  SpO2 96 % 02/04/23 0817  Vitals shown include unvalidated device data.  Last Pain:  Vitals:   02/04/23 0816  TempSrc: Temporal  PainSc: Asleep         Complications: No notable events documented.

## 2023-02-04 NOTE — Anesthesia Preprocedure Evaluation (Signed)
Anesthesia Evaluation  Patient identified by MRN, date of birth, ID band Patient awake    Reviewed: Allergy & Precautions, H&P , NPO status , Patient's Chart, lab work & pertinent test results, reviewed documented beta blocker date and time   History of Anesthesia Complications Negative for: history of anesthetic complications  Airway Mallampati: II   Neck ROM: full    Dental  (+) Poor Dentition, Dental Advidsory Given   Pulmonary neg pulmonary ROS   Pulmonary exam normal        Cardiovascular hypertension, (-) angina (-) Past MI and (-) Cardiac Stents Normal cardiovascular exam(-) dysrhythmias (-) Valvular Problems/Murmurs Rhythm:regular Rate:Normal     Neuro/Psych negative neurological ROS  negative psych ROS   GI/Hepatic negative GI ROS, Neg liver ROS,,,  Endo/Other  negative endocrine ROS    Renal/GU negative Renal ROS  negative genitourinary   Musculoskeletal   Abdominal   Peds  Hematology negative hematology ROS (+)   Anesthesia Other Findings Past Medical History: No date: Hypertension Past Surgical History: No date: COLONOSCOPY No date: FOOT SURGERY BMI    Body Mass Index:  25.61 kg/m     Reproductive/Obstetrics negative OB ROS                             Anesthesia Physical Anesthesia Plan  ASA: 2  Anesthesia Plan: General   Post-op Pain Management:    Induction: Intravenous  PONV Risk Score and Plan: 3 and Propofol infusion and TIVA  Airway Management Planned: Natural Airway and Nasal Cannula  Additional Equipment:   Intra-op Plan:   Post-operative Plan:   Informed Consent: I have reviewed the patients History and Physical, chart, labs and discussed the procedure including the risks, benefits and alternatives for the proposed anesthesia with the patient or authorized representative who has indicated his/her understanding and acceptance.     Dental  Advisory Given  Plan Discussed with: CRNA  Anesthesia Plan Comments:         Anesthesia Quick Evaluation

## 2023-02-04 NOTE — Interval H&P Note (Signed)
History and Physical Interval Note:  02/04/2023 7:52 AM  Kristina Jenkins  has presented today for surgery, with the diagnosis of HX of adenomatous polyp of colon.  The various methods of treatment have been discussed with the patient and family. After consideration of risks, benefits and other options for treatment, the patient has consented to  Procedure(s): COLONOSCOPY WITH PROPOFOL (N/A) as a surgical intervention.  The patient's history has been reviewed, patient examined, no change in status, stable for surgery.  I have reviewed the patient's chart and labs.  Questions were answered to the patient's satisfaction.     Lesly Rubenstein  Ok to proceed with colonoscopy

## 2023-02-04 NOTE — H&P (Signed)
Outpatient short stay form Pre-procedure 02/04/2023  Lesly Rubenstein, MD  Primary Physician: Gayland Curry, MD  Reason for visit:  Surveillance colonoscopy  History of present illness:    77 y/o lady with history of hypertension here for surveillance colonoscopy. Last colonoscopy in 2018 with two small Ta's. No blood thinners. No family history of GI malignancies. No significant abdominal surgeries.    Current Facility-Administered Medications:    0.9 %  sodium chloride infusion, , Intravenous, Continuous, Demerius Podolak, Hilton Cork, MD, Last Rate: 20 mL/hr at 02/04/23 0721, 1,000 mL at 02/04/23 0721  Medications Prior to Admission  Medication Sig Dispense Refill Last Dose   amLODipine (NORVASC) 5 MG tablet Take 5 mg by mouth daily.   02/04/2023 at [place] Take by mouth.   02/03/2023   Multiple Vitamin (MULTIVITAMIN) capsule Take 1 capsule by mouth daily.   02/03/2023   omega-3 acid ethyl esters (LOVAZA) 1 g capsule Take by mouth 2 (two) times daily.   02/03/2023   aspirin EC 81 MG tablet Take 81 mg by mouth daily. (Patient not taking: Reported on 02/04/2023)   Not Taking   cloNIDine (CATAPRES) 0.1 MG tablet Take 1 tablet (0.1 mg total) by mouth 3 (three) times daily as needed (hypertension). 30 tablet 11      Allergies  Allergen Reactions   Hctz [Hydrochlorothiazide]      Past Medical History:  Diagnosis Date   Hypertension     Review of systems:  Otherwise negative.    Physical Exam  Gen: Alert, oriented. Appears stated age.  HEENT: PERRLA. Lungs: No respiratory distress CV: RRR Abd: soft, benign, no masses Ext: No edema    Planned procedures: Proceed with colonoscopy. The patient understands the nature of the planned procedure, indications, risks, alternatives and potential complications including but not limited to bleeding, infection, perforation, damage to internal organs and possible oversedation/side effects from anesthesia. The patient agrees and  gives consent to proceed.  Please refer to procedure notes for findings, recommendations and patient disposition/instructions.     Lesly Rubenstein, MD [facility]

## 2023-02-04 NOTE — Op Note (Signed)
[facility] Patient Name: Kristina Jenkins Procedure Date: 02/04/2023 7:24 AM MRN: [id number] Account #: [id number] Date of Birth: 1946-10-06 Admit Type: Outpatient Age: 77 Room: [facility] ENDO ROOM 1 Gender: Female Note Status: Finalized Instrument Name: Park Meo [id number] Procedure:             Colonoscopy Indications:           Surveillance: Personal history of adenomatous polyps                         on last colonoscopy > 5 years ago Providers:             Andrey Farmer MD, MD Medicines:             Monitored Anesthesia Care Complications:         No immediate complications. Procedure:             Pre-Anesthesia Assessment:                        - Prior to the procedure, a History and Physical was                         performed, and patient medications and allergies were                         reviewed. The patient is competent. The risks and                         benefits of the procedure and the sedation options and                         risks were discussed with the patient. All questions                         were answered and informed consent was obtained.                         Patient identification and proposed procedure were                         verified by the physician, the nurse, the                         anesthesiologist, the anesthetist and the technician                         in the endoscopy suite. Mental Status Examination:                         alert and oriented. Airway Examination: normal                         oropharyngeal airway and neck mobility. Respiratory                         Examination: clear to auscultation. CV Examination:                         normal. Prophylactic Antibiotics: The patient does not  require prophylactic antibiotics. Prior                         Anticoagulants: The patient has taken no anticoagulant                         or antiplatelet agents. ASA  Grade Assessment: II - A                         patient with mild systemic disease. After reviewing                         the risks and benefits, the patient was deemed in                         satisfactory condition to undergo the procedure. The                         anesthesia plan was to use monitored anesthesia care                         (MAC). Immediately prior to administration of                         medications, the patient was re-assessed for adequacy                         to receive sedatives. The heart rate, respiratory                         rate, oxygen saturations, blood pressure, adequacy of                         pulmonary ventilation, and response to care were                         monitored throughout the procedure. The physical                         status of the patient was re-assessed after the                         procedure.                        After obtaining informed consent, the colonoscope was                         passed under direct vision. Throughout the procedure,                         the patient's blood pressure, pulse, and oxygen                         saturations were monitored continuously. The                         Colonoscope was introduced through the anus and  advanced to the the cecum, identified by appendiceal                         orifice and ileocecal valve. The colonoscopy was                         somewhat difficult due to a redundant colon. The                         patient tolerated the procedure well. The quality of                         the bowel preparation was good. The ileocecal valve,                         appendiceal orifice, and rectum were photographed. Findings:      The perianal and digital rectal examinations were normal.      A single large-mouthed diverticulum was found in the proximal transverse       colon.      Many small-mouthed diverticula were found in the  sigmoid colon.      Internal hemorrhoids were found during retroflexion. The hemorrhoids       were Grade I (internal hemorrhoids that do not prolapse).      The exam was otherwise without abnormality on direct and retroflexion       views. Impression:            - Diverticulosis in the proximal transverse colon.                        - Diverticulosis in the sigmoid colon.                        - Internal hemorrhoids.                        - The examination was otherwise normal on direct and                         retroflexion views.                        - No specimens collected. Recommendation:        - Discharge patient to home.                        - Resume previous diet.                        - Continue present medications.                        - Repeat colonoscopy is not recommended due to current                         age (20 years or older) for surveillance.                        - Return to referring physician as previously  scheduled. Procedure Code(s):     --- Professional ---                        H8527, Colorectal cancer screening; colonoscopy on                         individual at high risk Diagnosis Code(s):     --- Professional ---                        Z86.010, Personal history of colonic polyps                        K64.0, First degree hemorrhoids                        K57.30, Diverticulosis of large intestine without                         perforation or abscess without bleeding CPT copyright 2022 [employer]. All rights reserved. The codes documented in this report are preliminary and upon coder review may  be revised to meet current compliance requirements. Andrey Farmer MD, MD 02/04/2023 8:17:33 AM Number of Addenda: 0 Note Initiated On: 02/04/2023 7:24 AM Scope Withdrawal Time: 0 hours 6 minutes 42 seconds  Total Procedure Duration: 0 hours 13 minutes 25 seconds  Estimated Blood Loss:  Estimated blood  loss: none.      [facility]

## 2023-02-05 ENCOUNTER — Encounter: Payer: Self-pay | Admitting: Gastroenterology

## 2023-02-20 NOTE — Anesthesia Postprocedure Evaluation (Signed)
Anesthesia Post Note  Patient: Kristina Jenkins  Procedure(s) Performed: COLONOSCOPY WITH PROPOFOL  Patient location during evaluation: Endoscopy Anesthesia Type: General Level of consciousness: awake and alert Pain management: pain level controlled Vital Signs Assessment: post-procedure vital signs reviewed and stable Respiratory status: spontaneous breathing, nonlabored ventilation, respiratory function stable and patient connected to nasal cannula oxygen Cardiovascular status: blood pressure returned to baseline and stable Postop Assessment: no apparent nausea or vomiting Anesthetic complications: no   There were no known notable events for this encounter.   Last Vitals:  Vitals:   02/04/23 0707 02/04/23 0816  BP: 103/64 106/66  Pulse: 70   Resp: 18   Temp: (!) 36.2 C (!) 35.9 C  SpO2: 99%     Last Pain:  Vitals:   02/04/23 0836  TempSrc:   PainSc: 0-No pain                 Martha Clan

## 2023-04-30 ENCOUNTER — Ambulatory Visit: Payer: Medicare Other | Admitting: Dermatology

## 2024-03-19 ENCOUNTER — Other Ambulatory Visit: Payer: Self-pay | Admitting: Family Medicine

## 2024-03-19 DIAGNOSIS — Z1231 Encounter for screening mammogram for malignant neoplasm of breast: Secondary | ICD-10-CM

## 2024-03-30 ENCOUNTER — Ambulatory Visit
Admission: RE | Admit: 2024-03-30 | Discharge: 2024-03-30 | Disposition: A | Discharge status: Home / Self Care | Source: Ambulatory Visit | Attending: Family Medicine | Admitting: Family Medicine

## 2024-03-30 DIAGNOSIS — Z1231 Encounter for screening mammogram for malignant neoplasm of breast: Secondary | ICD-10-CM | POA: Insufficient documentation

## 2024-12-08 ENCOUNTER — Other Ambulatory Visit: Payer: Self-pay | Admitting: Orthopedic Surgery

## 2024-12-08 DIAGNOSIS — R2232 Localized swelling, mass and lump, left upper limb: Secondary | ICD-10-CM

## 2024-12-15 ENCOUNTER — Ambulatory Visit
Admission: RE | Admit: 2024-12-15 | Discharge: 2024-12-15 | Attending: Orthopedic Surgery | Admitting: Orthopedic Surgery

## 2024-12-15 DIAGNOSIS — R2232 Localized swelling, mass and lump, left upper limb: Secondary | ICD-10-CM | POA: Diagnosis present
